# Patient Record
Sex: Male | Born: 1970 | Race: Black or African American | Hispanic: No | Marital: Married | Smoking: Current every day smoker
Health system: Southern US, Community
[De-identification: ages and names within clinical notes are randomized; demographics above are authoritative.]

---

## 2020-06-08 ENCOUNTER — Encounter (HOSPITAL_COMMUNITY): Payer: Self-pay | Admitting: Emergency Medicine

## 2020-06-08 ENCOUNTER — Emergency Department (HOSPITAL_COMMUNITY)

## 2020-06-08 ENCOUNTER — Other Ambulatory Visit: Payer: Self-pay

## 2020-06-08 ENCOUNTER — Emergency Department (HOSPITAL_COMMUNITY)
Admission: EM | Admit: 2020-06-08 | Discharge: 2020-06-08 | Disposition: A | Discharge status: Home / Self Care | Attending: Emergency Medicine | Admitting: Emergency Medicine

## 2020-06-08 DIAGNOSIS — F17200 Nicotine dependence, unspecified, uncomplicated: Secondary | ICD-10-CM | POA: Insufficient documentation

## 2020-06-08 DIAGNOSIS — R0602 Shortness of breath: Secondary | ICD-10-CM | POA: Insufficient documentation

## 2020-06-08 DIAGNOSIS — Z20822 Contact with and (suspected) exposure to covid-19: Secondary | ICD-10-CM | POA: Insufficient documentation

## 2020-06-08 DIAGNOSIS — R072 Precordial pain: Secondary | ICD-10-CM

## 2020-06-08 LAB — COMPREHENSIVE METABOLIC PANEL
ALT: 28 U/L (ref 0–44)
AST: 24 U/L (ref 15–41)
Albumin: 3.8 g/dL (ref 3.5–5.0)
Alkaline Phosphatase: 66 U/L (ref 38–126)
Anion gap: 8 (ref 5–15)
BUN: 13 mg/dL (ref 6–20)
CO2: 23 mmol/L (ref 22–32)
Calcium: 8.9 mg/dL (ref 8.9–10.3)
Chloride: 107 mmol/L (ref 98–111)
Creatinine, Ser: 1.08 mg/dL (ref 0.61–1.24)
GFR, Estimated: >60 mL/min (ref >60)
Glucose, Bld: 121 mg/dL — ABNORMAL HIGH (ref 70–99)
Potassium: 3.5 mmol/L (ref 3.5–5.1)
Sodium: 138 mmol/L (ref 135–145)
Total Bilirubin: 0.6 mg/dL (ref 0.3–1.2)
Total Protein: 7 g/dL (ref 6.5–8.1)

## 2020-06-08 LAB — CBC WITH DIFFERENTIAL/PLATELET
Abs Immature Granulocytes: 0.03 10*3/uL (ref 0.00–0.07)
Basophils Absolute: 0.1 10*3/uL (ref 0.0–0.1)
Basophils Relative: 1 %
Eosinophils Absolute: 0.2 10*3/uL (ref 0.0–0.5)
Eosinophils Relative: 2 %
HCT: 47 % (ref 39.0–52.0)
Hemoglobin: 15.5 g/dL (ref 13.0–17.0)
Immature Granulocytes: 0 %
Lymphocytes Relative: 42 %
Lymphs Abs: 4 10*3/uL (ref 0.7–4.0)
MCH: 31.6 pg (ref 26.0–34.0)
MCHC: 33 g/dL (ref 30.0–36.0)
MCV: 95.9 fL (ref 80.0–100.0)
Monocytes Absolute: 0.8 10*3/uL (ref 0.1–1.0)
Monocytes Relative: 8 %
Neutro Abs: 4.5 10*3/uL (ref 1.7–7.7)
Neutrophils Relative %: 47 %
Platelets: 222 10*3/uL (ref 150–400)
RBC: 4.9 MIL/uL (ref 4.22–5.81)
RDW: 13.4 % (ref 11.5–15.5)
WBC: 9.6 10*3/uL (ref 4.0–10.5)
nRBC: 0 % (ref 0.0–0.2)

## 2020-06-08 LAB — TROPONIN I (HIGH SENSITIVITY)
Troponin I (High Sensitivity): 3 ng/L (ref <18)
Troponin I (High Sensitivity): 3 ng/L (ref <18)

## 2020-06-08 LAB — SARS CORONAVIRUS 2 (TAT 6-24 HRS): SARS Coronavirus 2: NEGATIVE

## 2020-06-08 MED ORDER — ONDANSETRON 8 MG PO TBDP
8.0000 mg | ORAL_TABLET | Freq: Once | ORAL | Status: AC
  Administered 2020-06-08: 8 mg via ORAL
  Filled 2020-06-08: qty 1

## 2020-06-08 MED ORDER — ALUM & MAG HYDROXIDE-SIMETH 200-200-20 MG/5ML PO SUSP
30.0000 mL | Freq: Once | ORAL | Status: AC
  Administered 2020-06-08: 30 mL via ORAL
  Filled 2020-06-08: qty 30

## 2020-06-08 MED ORDER — OMEPRAZOLE 20 MG PO CPDR: 20.0000 mg | DELAYED_RELEASE_CAPSULE | Freq: Every day | ORAL | 0 refills | Status: AC

## 2020-06-08 NOTE — ED Triage Notes (Signed)
Patient states that he woke up out of his sleep not being able to breathe that started around 3 am. Patient also states he spit up felm and was sweating.

## 2020-06-08 NOTE — ED Notes (Signed)
Patient left AMA. Did not "have time' to wait for the troponin results. MD made aware.

## 2020-06-08 NOTE — ED Provider Notes (Signed)
[facility]-EMERGENCY DEPT Provider Note   CSN: [id number] Arrival date & time: 06/08/20  0329     History Chief Complaint  Patient presents with  . Shortness of Breath  . Fever    Paul Anderson is a 50 y.o. male.  The history is provided by the patient.  Shortness of Breath Severity:  Severe Onset quality:  Sudden Timing:  Constant Progression:  Resolved Chronicity:  New Context comment:  After eating Popeyes and Smoky Bones  Relieved by:  Nothing Worsened by:  Nothing Ineffective treatments:  None tried Associated symptoms: vomiting   Associated symptoms: no claudication, no diaphoresis, no neck pain, no rash, no sputum production and no wheezing   Associated symptoms comment:  "felt warm" while vomiting  Risk factors: no recent surgery   Patient with GERD who is not on medication who ate smokey Bones and then Popeyes presents with sudden onset emesis, feeling warm with this but no fever and shortness of breath.  No DOE, no exertional symptoms.      History reviewed. No pertinent past medical history.  There are no problems to display for this patient.   History reviewed. No pertinent surgical history.     History reviewed. No pertinent family history.  Social History   Tobacco Use  . Smoking status: Current Every Day Smoker  . Smokeless tobacco: Never Used  Vaping Use  . Vaping Use: Never used  Substance Use Topics  . Alcohol use: Yes  . Drug use: Never    Home Medications Prior to Admission medications   Not on File    Allergies    Patient has no known allergies.  Review of Systems   Review of Systems  Constitutional: Negative for diaphoresis.  HENT: Negative for congestion.   Eyes: Negative for visual disturbance.  Respiratory: Positive for shortness of breath. Negative for sputum production and wheezing.   Cardiovascular: Negative for palpitations, claudication and leg swelling.  Gastrointestinal: Positive for  vomiting. Negative for diarrhea.  Genitourinary: Negative for flank pain.  Musculoskeletal: Negative for neck pain.  Skin: Negative for rash.  Neurological: Negative for dizziness.  Psychiatric/Behavioral: Negative for agitation.    Physical Exam Updated Vital Signs BP (!) 141/98 (BP Location: Right Arm)   Pulse 86   Temp 98.4 F (36.9 C) (Oral)   Resp 16   Ht 5\' 5"  (1.651 m)   Wt 102.1 kg   SpO2 98%   BMI 37.44 kg/m   Physical Exam Vitals and nursing note reviewed.  Constitutional:      Appearance: Normal appearance. He is not diaphoretic.  HENT:     Head: Normocephalic and atraumatic.     Nose: Nose normal.  Eyes:     Conjunctiva/sclera: Conjunctivae normal.     Pupils: Pupils are equal, round, and reactive to light.  Cardiovascular:     Rate and Rhythm: Normal rate and regular rhythm.     Pulses: Normal pulses.     Heart sounds: Normal heart sounds.  Pulmonary:     Effort: Pulmonary effort is normal.     Breath sounds: Normal breath sounds.  Abdominal:     General: Abdomen is flat. Bowel sounds are normal.     Palpations: Abdomen is soft.     Tenderness: There is no abdominal tenderness. There is no guarding.  Musculoskeletal:        General: Normal range of motion.     Cervical back: Normal range of motion and neck supple.  Right lower leg: No edema.     Left lower leg: No edema.  Skin:    General: Skin is warm and dry.     Capillary Refill: Capillary refill takes less than 2 seconds.  Neurological:     General: No focal deficit present.     Mental Status: He is alert and oriented to person, place, and time.     Deep Tendon Reflexes: Reflexes normal.  Psychiatric:        Mood and Affect: Mood normal.        Behavior: Behavior normal.     ED Results / Procedures / Treatments   Labs (all labs ordered are listed, but only abnormal results are displayed) Results for orders placed or performed during the hospital encounter of 06/08/20  CBC with  Differential  Result Value Ref Range   WBC 9.6 4.0 - 10.5 K/uL   RBC 4.90 4.22 - 5.81 MIL/uL   Hemoglobin 15.5 13.0 - 17.0 g/dL   HCT 63.8 46.6 - 59.9 %   MCV 95.9 80.0 - 100.0 fL   MCH 31.6 26.0 - 34.0 pg   MCHC 33.0 30.0 - 36.0 g/dL   RDW 35.7 01.7 - 79.3 %   Platelets 222 150 - 400 K/uL   nRBC 0.0 0.0 - 0.2 %   Neutrophils Relative % 47 %   Neutro Abs 4.5 1.7 - 7.7 K/uL   Lymphocytes Relative 42 %   Lymphs Abs 4.0 0.7 - 4.0 K/uL   Monocytes Relative 8 %   Monocytes Absolute 0.8 0.1 - 1.0 K/uL   Eosinophils Relative 2 %   Eosinophils Absolute 0.2 0.0 - 0.5 K/uL   Basophils Relative 1 %   Basophils Absolute 0.1 0.0 - 0.1 K/uL   Immature Granulocytes 0 %   Abs Immature Granulocytes 0.03 0.00 - 0.07 K/uL  Comprehensive metabolic panel  Result Value Ref Range   Sodium 138 135 - 145 mmol/L   Potassium 3.5 3.5 - 5.1 mmol/L   Chloride 107 98 - 111 mmol/L   CO2 23 22 - 32 mmol/L   Glucose, Bld 121 (H) 70 - 99 mg/dL   BUN 13 6 - 20 mg/dL   Creatinine, Ser 9.03 0.61 - 1.24 mg/dL   Calcium 8.9 8.9 - 00.9 mg/dL   Total Protein 7.0 6.5 - 8.1 g/dL   Albumin 3.8 3.5 - 5.0 g/dL   AST 24 15 - 41 U/L   ALT 28 0 - 44 U/L   Alkaline Phosphatase 66 38 - 126 U/L   Total Bilirubin 0.6 0.3 - 1.2 mg/dL   GFR, Estimated >23 >30 mL/min   Anion gap 8 5 - 15  Troponin I (High Sensitivity)  Result Value Ref Range   Troponin I (High Sensitivity) 3 <18 ng/L   DG Chest 2 View  Result Date: 06/08/2020 CLINICAL DATA:  Shortness of breath. EXAM: CHEST - 2 VIEW COMPARISON:  None. FINDINGS: The heart size and mediastinal contours are within normal limits. No focal consolidation. No pulmonary edema. No pleural effusion. No pneumothorax. No acute osseous abnormality. IMPRESSION: No active cardiopulmonary disease. Electronically Signed   By: Tish Frederickson M.D.   On: 06/08/2020 04:05    EKG EKG Interpretation  Date/Time:  Saturday June 08 2020 03:41:46 EDT Ventricular Rate:  87 PR  Interval:  166 QRS Duration: 86 QT Interval:  361 QTC Calculation: 435 R Axis:   95 Text Interpretation: Sinus rhythm Confirmed by Nicanor Alcon, Raechel Marcos ([id number]) on 06/08/2020 4:09:49 AM  Radiology DG Chest 2 View  Result Date: 06/08/2020 CLINICAL DATA:  Shortness of breath. EXAM: CHEST - 2 VIEW COMPARISON:  None. FINDINGS: The heart size and mediastinal contours are within normal limits. No focal consolidation. No pulmonary edema. No pleural effusion. No pneumothorax. No acute osseous abnormality. IMPRESSION: No active cardiopulmonary disease. Electronically Signed   By: Tish Frederickson M.D.   On: 06/08/2020 04:05    Procedures Procedures   Medications Ordered in ED Medications  ondansetron (ZOFRAN-ODT) disintegrating tablet 8 mg (8 mg Oral Given 06/08/20 0544)  alum & mag hydroxide-simeth (MAALOX/MYLANTA) 200-200-20 MG/5ML suspension 30 mL (30 mLs Oral Given 06/08/20 0544)    ED Course  I have reviewed the triage vital signs and the nursing notes.  Pertinent labs & imaging results that were available during my care of the patient were reviewed by me and considered in my medical decision making (see chart for details).   Ruled out for MI in the ED. Heart score is 1 very low risk for MACE. Symptoms have resolved.  I believe this is consistent with GERD based on symptom timing and diet.  Will start PPI and gerd friendly diet.     Finnbar Cedillos was evaluated in Emergency Department on 06/08/2020 for the symptoms described in the history of present illness. He was evaluated in the context of the global COVID-19 pandemic, which necessitated consideration that the patient might be at risk for infection with the SARS-CoV-2 virus that causes COVID-19. Institutional protocols and algorithms that pertain to the evaluation of patients at risk for COVID-19 are in a state of rapid change based on information released by regulatory bodies including the CDC and federal and state organizations. These policies and  algorithms were followed during the patient's care in the ED.  Final Clinical Impression(s) / ED Diagnoses Return for intractable cough, coughing up blood, fevers >100.4 unrelieved by medication, shortness of breath, intractable vomiting, chest pain, shortness of breath, weakness, numbness, changes in speech, facial asymmetry, abdominal pain, passing out, Inability to tolerate liquids or food, cough, altered mental status or any concerns. No signs of systemic illness or infection. The patient is nontoxic-appearing on exam and vital signs are within normal limits.  I have reviewed the triage vital signs and the nursing notes. Pertinent labs & imaging results that were available during my care of the patient were reviewed by me and considered in my medical decision making (see chart for details). After history, exam, and medical workup I feel the patient has been appropriately medically screened and is safe for discharge home. Pertinent diagnoses were discussed with the patient. Patient was given return precautions.   Kaisei Gilbo, MD 06/08/20 1740

## 2022-03-24 IMAGING — CR DG CHEST 2V
2 series · 2 of 2 positions shown · non-contrast
Comparison: None.

CLINICAL DATA: Shortness of breath.

EXAM:
CHEST - 2 VIEW

[w chest pa]
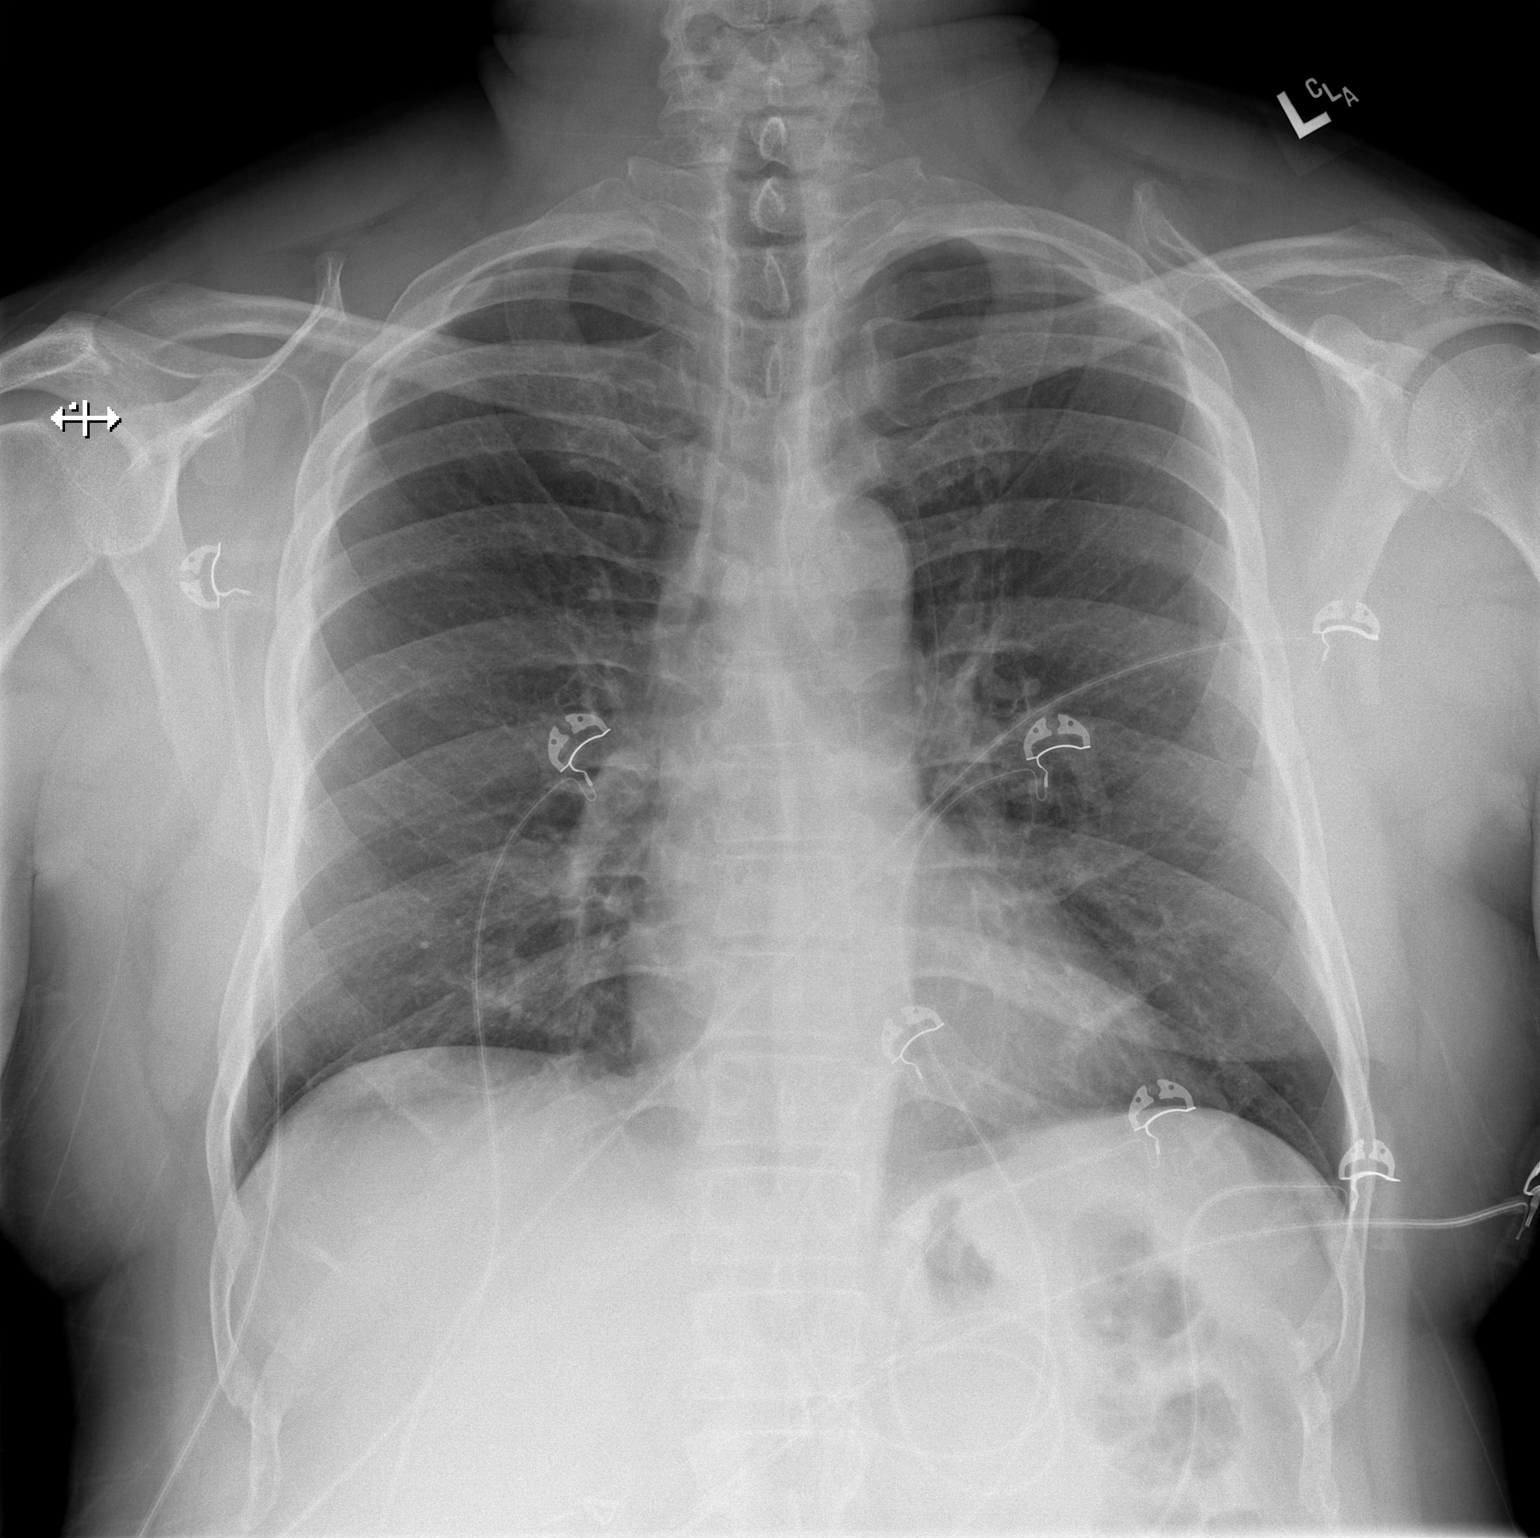

[w chest lat]
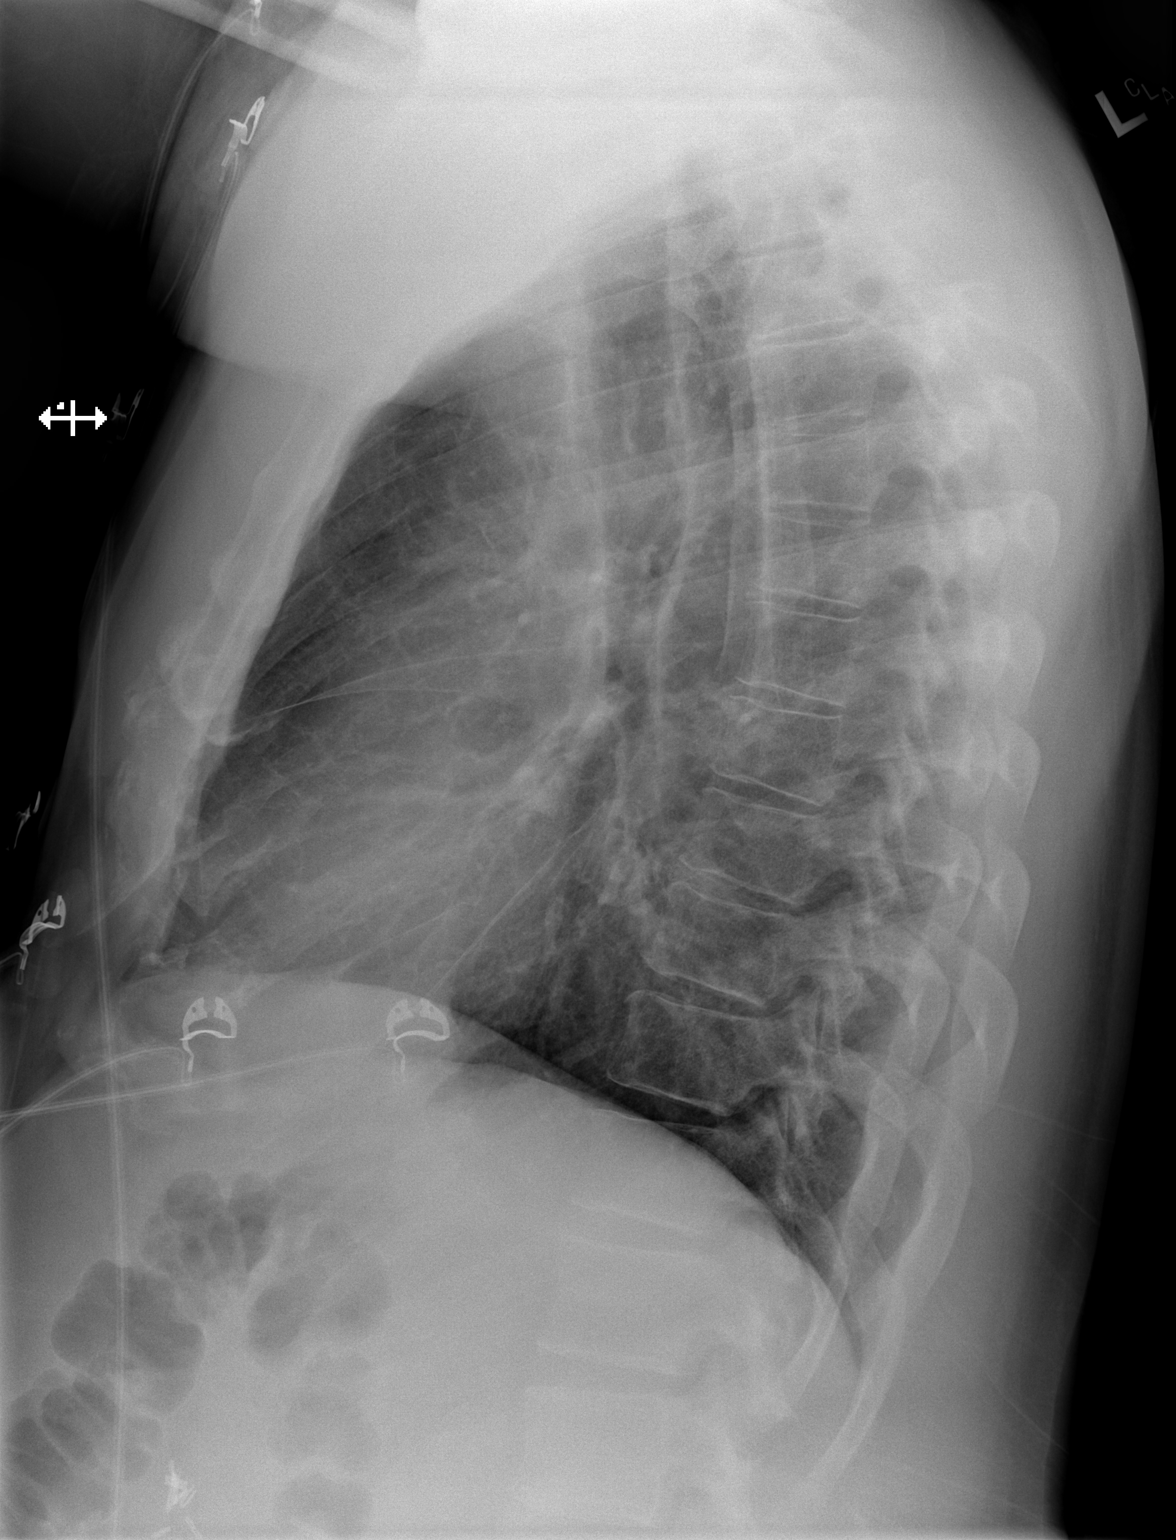

[2 of 2 positions shown; findings below may reference images not displayed]

FINDINGS: The heart size and mediastinal contours are within normal limits.

No focal consolidation. No pulmonary edema. No pleural effusion. No
pneumothorax.

No acute osseous abnormality.
IMPRESSION: No active cardiopulmonary disease.
